# Patient Record
Sex: Male | Born: 1965 | Race: Black or African American | Hispanic: No | Marital: Married | Smoking: Never smoker
Health system: Southern US, Community
[De-identification: ages and names within clinical notes are randomized; demographics above are authoritative.]

## PROBLEM LIST (undated history)

## (undated) DIAGNOSIS — E119 Type 2 diabetes mellitus without complications: Secondary | ICD-10-CM

## (undated) DIAGNOSIS — I10 Essential (primary) hypertension: Secondary | ICD-10-CM

## (undated) DIAGNOSIS — M109 Gout, unspecified: Secondary | ICD-10-CM

---

## 2009-12-11 ENCOUNTER — Emergency Department: Payer: Self-pay | Admitting: Emergency Medicine

## 2010-02-24 ENCOUNTER — Emergency Department: Payer: Self-pay | Admitting: Emergency Medicine

## 2014-11-06 ENCOUNTER — Observation Stay: Payer: Self-pay | Admitting: Internal Medicine

## 2014-11-06 LAB — CBC WITH DIFFERENTIAL/PLATELET
BASOS PCT: 1.2 %
Basophil #: 0.1 10*3/uL (ref 0.0–0.1)
Eosinophil #: 0.1 10*3/uL (ref 0.0–0.7)
Eosinophil %: 1.6 %
HCT: 41.8 % (ref 40.0–52.0)
HGB: 13.2 g/dL (ref 13.0–18.0)
Lymphocyte #: 1.9 10*3/uL (ref 1.0–3.6)
Lymphocyte %: 26.3 %
MCH: 26.8 pg (ref 26.0–34.0)
MCHC: 31.6 g/dL — AB (ref 32.0–36.0)
MCV: 85 fL (ref 80–100)
Monocyte #: 0.6 x10 3/mm (ref 0.2–1.0)
Monocyte %: 8.5 %
NEUTROS ABS: 4.4 10*3/uL (ref 1.4–6.5)
Neutrophil %: 62.4 %
Platelet: 161 10*3/uL (ref 150–440)
RBC: 4.93 10*6/uL (ref 4.40–5.90)
RDW: 14.3 % (ref 11.5–14.5)
WBC: 7.1 10*3/uL (ref 3.8–10.6)

## 2014-11-06 LAB — COMPREHENSIVE METABOLIC PANEL
Albumin: 4.6 g/dL (ref 3.4–5.0)
Alkaline Phosphatase: 68 U/L
Anion Gap: 9 (ref 7–16)
BILIRUBIN TOTAL: 0.4 mg/dL (ref 0.2–1.0)
BUN: 56 mg/dL — ABNORMAL HIGH (ref 7–18)
CALCIUM: 10 mg/dL (ref 8.5–10.1)
CO2: 27 mmol/L (ref 21–32)
Chloride: 95 mmol/L — ABNORMAL LOW (ref 98–107)
Creatinine: 2.23 mg/dL — ABNORMAL HIGH (ref 0.60–1.30)
EGFR (African American): 41 — ABNORMAL LOW
GFR CALC NON AF AMER: 34 — AB
GLUCOSE: 438 mg/dL — AB (ref 65–99)
OSMOLALITY: 297 (ref 275–301)
POTASSIUM: 5.6 mmol/L — AB (ref 3.5–5.1)
SGOT(AST): 43 U/L — ABNORMAL HIGH (ref 15–37)
SGPT (ALT): 70 U/L — ABNORMAL HIGH
Sodium: 131 mmol/L — ABNORMAL LOW (ref 136–145)
Total Protein: 8.7 g/dL — ABNORMAL HIGH (ref 6.4–8.2)

## 2014-11-06 LAB — URINALYSIS, COMPLETE
BILIRUBIN, UR: NEGATIVE
BLOOD: NEGATIVE
Glucose,UR: >=500 mg/dL (ref 0–75)
Hyaline Cast: 5
KETONE: NEGATIVE
LEUKOCYTE ESTERASE: NEGATIVE
NITRITE: NEGATIVE
Ph: 5 (ref 4.5–8.0)
Protein: NEGATIVE
RBC,UR: 1 /HPF (ref 0–5)
SPECIFIC GRAVITY: 1.015 (ref 1.003–1.030)
Squamous Epithelial: <1
WBC UR: 1 /HPF (ref 0–5)

## 2014-11-06 LAB — BASIC METABOLIC PANEL
Anion Gap: 10 (ref 7–16)
BUN: 53 mg/dL — ABNORMAL HIGH (ref 7–18)
CALCIUM: 8.8 mg/dL (ref 8.5–10.1)
CO2: 24 mmol/L (ref 21–32)
Chloride: 99 mmol/L (ref 98–107)
Creatinine: 2.02 mg/dL — ABNORMAL HIGH (ref 0.60–1.30)
EGFR (African American): 46 — ABNORMAL LOW
GFR CALC NON AF AMER: 38 — AB
Glucose: 385 mg/dL — ABNORMAL HIGH (ref 65–99)
OSMOLALITY: 297 (ref 275–301)
POTASSIUM: 5.4 mmol/L — AB (ref 3.5–5.1)
Sodium: 133 mmol/L — ABNORMAL LOW (ref 136–145)

## 2014-11-06 LAB — PRO B NATRIURETIC PEPTIDE: B-TYPE NATIURETIC PEPTID: 11 pg/mL (ref 0–125)

## 2014-11-06 LAB — TROPONIN I: Troponin-I: <0.02 ng/mL

## 2014-11-06 LAB — LIPASE, BLOOD: Lipase: 119 U/L (ref 73–393)

## 2014-11-07 LAB — BASIC METABOLIC PANEL
ANION GAP: 7 (ref 7–16)
BUN: 40 mg/dL — ABNORMAL HIGH (ref 7–18)
CALCIUM: 8.7 mg/dL (ref 8.5–10.1)
Chloride: 100 mmol/L (ref 98–107)
Co2: 26 mmol/L (ref 21–32)
Creatinine: 1.83 mg/dL — ABNORMAL HIGH (ref 0.60–1.30)
EGFR (Non-African Amer.): 42 — ABNORMAL LOW
GFR CALC AF AMER: 51 — AB
GLUCOSE: 240 mg/dL — AB (ref 65–99)
OSMOLALITY: 284 (ref 275–301)
Potassium: 4.1 mmol/L (ref 3.5–5.1)
Sodium: 133 mmol/L — ABNORMAL LOW (ref 136–145)

## 2014-11-07 LAB — HEMOGLOBIN A1C: Hemoglobin A1C: 12.1 % — ABNORMAL HIGH (ref 4.2–6.3)

## 2014-11-07 LAB — LIPID PANEL
Cholesterol: 260 mg/dL — ABNORMAL HIGH (ref 0–200)
HDL Cholesterol: 28 mg/dL — ABNORMAL LOW (ref 40–60)
TRIGLYCERIDES: 659 mg/dL — AB (ref 0–200)

## 2014-11-07 LAB — CBC WITH DIFFERENTIAL/PLATELET
Basophil #: 0 10*3/uL (ref 0.0–0.1)
Basophil %: 0.7 %
EOS PCT: 2.3 %
Eosinophil #: 0.1 10*3/uL (ref 0.0–0.7)
HCT: 37.3 % — ABNORMAL LOW (ref 40.0–52.0)
HGB: 11.9 g/dL — ABNORMAL LOW (ref 13.0–18.0)
LYMPHS ABS: 1.6 10*3/uL (ref 1.0–3.6)
LYMPHS PCT: 29.6 %
MCH: 27.3 pg (ref 26.0–34.0)
MCHC: 32 g/dL (ref 32.0–36.0)
MCV: 85 fL (ref 80–100)
Monocyte #: 0.5 x10 3/mm (ref 0.2–1.0)
Monocyte %: 8.5 %
NEUTROS ABS: 3.2 10*3/uL (ref 1.4–6.5)
Neutrophil %: 58.9 %
Platelet: 141 10*3/uL — ABNORMAL LOW (ref 150–440)
RBC: 4.37 10*6/uL — ABNORMAL LOW (ref 4.40–5.90)
RDW: 14.5 % (ref 11.5–14.5)
WBC: 5.5 10*3/uL (ref 3.8–10.6)

## 2014-11-07 LAB — CLOSTRIDIUM DIFFICILE(ARMC)

## 2014-11-08 LAB — COMPREHENSIVE METABOLIC PANEL WITH GFR
Albumin: 3.8 g/dL
Alkaline Phosphatase: 54 U/L
Anion Gap: 6 — ABNORMAL LOW
BUN: 27 mg/dL — ABNORMAL HIGH
Bilirubin,Total: 0.4 mg/dL
Calcium, Total: 8.8 mg/dL
Chloride: 101 mmol/L
Co2: 27 mmol/L
Creatinine: 1.62 mg/dL — ABNORMAL HIGH
EGFR (African American): 59 — ABNORMAL LOW
EGFR (Non-African Amer.): 49 — ABNORMAL LOW
Glucose: 193 mg/dL — ABNORMAL HIGH
Osmolality: 279
Potassium: 4.3 mmol/L
SGOT(AST): 38 U/L — ABNORMAL HIGH
SGPT (ALT): 64 U/L — ABNORMAL HIGH
Sodium: 134 mmol/L — ABNORMAL LOW
Total Protein: 7 g/dL

## 2014-11-08 LAB — CBC WITH DIFFERENTIAL/PLATELET
Basophil #: 0 x10 3/mm 3
Basophil %: 1 %
Eosinophil #: 0.1 x10 3/mm 3
Eosinophil %: 2.4 %
HCT: 36.4 % — ABNORMAL LOW
HGB: 11.6 g/dL — ABNORMAL LOW
Lymphocyte %: 33.4 %
Lymphs Abs: 1.7 x10 3/mm 3
MCH: 26.7 pg
MCHC: 31.9 g/dL — ABNORMAL LOW
MCV: 84 fL
Monocyte #: 0.5 "x10 3/mm "
Monocyte %: 9.1 %
Neutrophil #: 2.7 x10 3/mm 3
Neutrophil %: 54.1 %
Platelet: 142 x10 3/mm 3 — ABNORMAL LOW
RBC: 4.35 x10 6/mm 3 — ABNORMAL LOW
RDW: 13.7 %
WBC: 4.9 x10 3/mm 3

## 2014-11-10 LAB — STOOL CULTURE

## 2014-11-14 ENCOUNTER — Ambulatory Visit: Payer: Self-pay | Admitting: Internal Medicine

## 2014-11-27 ENCOUNTER — Ambulatory Visit: Payer: Self-pay | Admitting: Internal Medicine

## 2014-12-26 ENCOUNTER — Ambulatory Visit
Admit: 2014-12-26 | Disposition: A | Discharge status: Home / Self Care | Payer: Self-pay | Attending: Internal Medicine | Admitting: Internal Medicine

## 2015-02-25 NOTE — Discharge Summary (Signed)
PATIENT NAME:  [name], George MR#:  [id number] DATE OF BIRTH:  16-Aug-1966  DATE OF ADMISSION:  11/06/2014 DATE OF DISCHARGE:  11/08/2014  ADMITTING DIAGNOSES:  Nausea, weakness.     DISCHARGE DIAGNOSES:  1.  Nausea and weakness due to acute renal failure and diarrhea.  2.  Acute renal failure due to prerenal in nature, improved with IV hydration, and holding of lisinopril hydrochlorothiazide and aldactone.   3.  New onset diabetes. The patient started on insulin with a hemoglobin A1c of 12.  4.  Nausea, diarrhea, likely due to gastroenteritis, now resolved.  5.  Hyperlipidemia.  6.  Hypertension.  7.  Morbid obesity.   CONSULTANTS: Diabetic teaching.   PERTINENT LABORATORY DATA AND EVALUATIONS: Admitting glucose 438, BUN 56, creatinine 2.23, sodium 131, potassium 5.6, chloride 95, CO2 27, calcium 10, lipase 119. LFTs showed a total protein of 8.7, albumin of 4.6, AST 43, ALT 70. troponin less than 0.02. WBC 7.1, hemoglobin 13.2, platelet count was 161,000. Stool cultures, no growth.  Clostridium difficile was negative. Urinalysis showed nitrites negative, leukocytes negative.   HOSPITAL COURSE: Please refer to H and P done by the admitting physician. The patient is a 49 year old African American male who presented with nausea, vomiting, and feeling weak. The patient was seen in the ED and was noted to have acute renal failure; therefore, he was admitted for IV fluids.  He was also noticed to have very high blood sugars. He was noted to have a new onset diabetes. The patient was admitted, given IV fluids with improvement in his renal function. He also, for his diabetes, was started on insulin and received diabetic teaching.  The patient's blood sugars are improved. His renal functions improved. I still recommended that he continue to hold lisinopril hydrochlorothiazide, spironolactone and potassium supplements until okayed by his primary care provider.  At this time, he is doing much better and is  stable for discharge.   DISCHARGE MEDICATIONS: Atenolol 25 p.o. daily, amlodipine 10 daily, Clobetasol topically applied to affected area b.i.d. as previously, glipizide 5 mg daily, fenofibrate 145 mg daily, Lantus 18 units at bedtime.   DIET: Low sodium, low fat, low cholesterol, carbohydrate-controlled diet.   ACTIVITY: As tolerated.   FOLLOW-UP:  Primary MD in 1 to 2 weeks, keep a blood sugar log to take to primary MD.    TIME SPENT: This patient: 35 minutes.    ____________________________ Lacie ScottsShreyang H. Allena KatzPatel, MD shp:DT D: 11/09/2014 13:13:10 ET T: 11/09/2014 19:45:48 ET JOB#: [id number]  cc: Maron Stanzione H. Allena KatzPatel, MD, <Dictator> Charise CarwinSHREYANG H Chett Taniguchi MD ELECTRONICALLY SIGNED 11/13/2014 12:43

## 2015-02-25 NOTE — H&P (Signed)
PATIENT NAME:  George Adkins, George Adkins MR#:  [id number] DATE OF BIRTH:  [birth year]  DATE OF ADMISSION:  11/06/2014  PRIMARY CARE PHYSICIAN: [facility]  REFERRING EMERGENCY ROOM PHYSICIAN: Cecille Amsterdam. Mayford Knife, MD  CHIEF COMPLAINT: Nausea, and feeling weak.   HISTORY OF PRESENT ILLNESS: A 49 year old male who has a history of hypertension and was told to be having prediabetes 2 years ago, for the last 3 weeks has complained of having some nausea and having some loose stool intermittently. Initially thought maybe it is just viral syndrome and will go away, but he continued to have these symptoms more and more, and feeling overall weak, so finally decided to come to the Emergency Room today. In the ER, he was noticed to have elevated blood sugar levels and renal failure. He was given some IV fluids and given insulin, but sugar level did not came down satisfactorily, as his renal function also did not improve much, so given to hospitalist team for further management. On further questioning, he denies any fever or chills.   REVIEW OF SYSTEMS:  CONSTITUTIONAL: Negative for fever. Positive for fatigue and generalized weakness. No weight loss or weight gain.  EYES: No blurring, double vision, discharge or redness.  EARS, NOSE, THROAT: No tinnitus, ear pain or hearing loss.  RESPIRATORY: No cough, wheezing, hemoptysis, or shortness of breath.  CARDIOVASCULAR: No chest pain, orthopnea, edema, arrhythmia, palpitation.  GASTROINTESTINAL: The patient has some nausea and some loose stool. No constipation or abdominal pain.  GENITOURINARY: No dysuria, hematuria, increased frequency.  ENDOCRINE: No heat or cold intolerance. No excessive sweating.  SKIN: No acne, rashes, or lesions.  MUSCULOSKELETAL: No pain or swelling in the joints.  NEUROLOGICAL: No numbness, weakness, tremor, or vertigo.  PSYCHIATRIC: No anxiety, insomnia, bipolar disorder.   PAST MEDICAL HISTORY:  1.  Hypertension.  2.  Prediabetes.   3.  He was told he was having some problem with the kidneys, but does not remember, 2 years ago, and after that he does not know anything his PMD told about that on followup visits.   SURGICAL HISTORY: Chronic venous congestion, in the lower extremities and has had venous surgeries done on lower extremities.   SOCIAL HISTORY: Lives with family, his wife, and he works in Insurance account manager currently. He denies smoking. He is a social alcohol drinker, not every day, and denies any illegal drug use.   FAMILY HISTORY: Positive for hypertension, diabetes, and congestive heart failure in multiple family members, including his mother.   HOME MEDICATIONS:  1.  Spironolactone 25 mg oral 2 tablets once a day.  2.  Lisinopril 20 mg once a day.  3.  Potassium chloride 20 mEq once a day.  4.  Hydrochlorothiazide 25 mg take 1/2 tablet once a day.  5.  Clobetasol 0.05% topical cream to affected area (mostly on the scalp) 2 times a day.  6.  Atenolol 25 mg once a day.  7.  Amlodipine 10 mg once a day.   ASSESSMENT AND PLAN: A 49 year old male who has a history of prediabetes and some renal issues, and has hypertension, came to the Emergency Room with some vague complaints and found having elevated blood sugar level and renal failure.  1.  Acute renal failure. Most likely there is a component of chronic renal failure also; we do not have baseline laboratories to compare with. We will give IV fluids for now and follow renal function tomorrow. Meanwhile, we will hold any nephrotoxic medications like Lisinopril and hydrochlorothiazide.  2.  Diabetes. Blood sugar is 400. The patient is obese. We counseled him about diet control, and as sugar is too high, he will need to be started on treatment right now. Because of renal failure, he is not a good candidate for oral antidiabetic medications now. I started him on long-acting insulin and sliding scale coverage. If his kidney function improved significantly, then we can switch  it to oral medications, like metformin, on discharge.  3.  Hypertension. Blood pressure is under control currently, but he was taking lisinopril and hydrochlorothiazide. I will hold that and continue monitoring his blood pressure.  4.  Obesity. Will check lipid panel tomorrow morning. We will treat accordingly.  5.  Hypokalemia. Potassium level is high with renal failure, but came down with IV fluid up to 5.4. There are no EKG changes. Will continue monitoring and follow tomorrow.   CODE STATUS: Full Code.   TOTAL TIME SPENT ON THIS ADMISSION: 50 minutes.    ____________________________ Hope PigeonVaibhavkumar G. Elisabeth PigeonVachhani, MD vgv:MT D: 11/06/2014 14:13:19 ET T: 11/06/2014 15:02:00 ET JOB#: [id number]  cc: Hope PigeonVaibhavkumar G. Elisabeth PigeonVachhani, MD, <Dictator> Altamese DillingVAIBHAVKUMAR Reba Hulett MD ELECTRONICALLY SIGNED 11/20/2014 23:08

## 2015-07-30 ENCOUNTER — Encounter: Payer: Self-pay | Admitting: Dietician

## 2018-02-17 ENCOUNTER — Other Ambulatory Visit: Payer: Self-pay | Admitting: Internal Medicine

## 2018-02-17 DIAGNOSIS — E269 Hyperaldosteronism, unspecified: Secondary | ICD-10-CM

## 2018-02-22 ENCOUNTER — Ambulatory Visit: Admission: RE | Admit: 2018-02-22 | Payer: Managed Care, Other (non HMO) | Source: Ambulatory Visit

## 2018-07-12 ENCOUNTER — Ambulatory Visit
Admission: RE | Admit: 2018-07-12 | Discharge: 2018-07-12 | Disposition: A | Discharge status: Home / Self Care | Payer: Managed Care, Other (non HMO) | Source: Ambulatory Visit | Attending: Internal Medicine | Admitting: Internal Medicine

## 2018-07-12 DIAGNOSIS — E279 Disorder of adrenal gland, unspecified: Secondary | ICD-10-CM | POA: Insufficient documentation

## 2018-07-12 DIAGNOSIS — R160 Hepatomegaly, not elsewhere classified: Secondary | ICD-10-CM | POA: Diagnosis not present

## 2018-07-12 DIAGNOSIS — K760 Fatty (change of) liver, not elsewhere classified: Secondary | ICD-10-CM | POA: Diagnosis not present

## 2018-07-12 DIAGNOSIS — E269 Hyperaldosteronism, unspecified: Secondary | ICD-10-CM

## 2018-07-14 ENCOUNTER — Other Ambulatory Visit: Payer: Self-pay | Admitting: Internal Medicine

## 2018-07-14 DIAGNOSIS — E278 Other specified disorders of adrenal gland: Secondary | ICD-10-CM

## 2018-07-28 ENCOUNTER — Ambulatory Visit: Admission: RE | Admit: 2018-07-28 | Payer: Managed Care, Other (non HMO) | Source: Ambulatory Visit

## 2018-08-10 ENCOUNTER — Ambulatory Visit
Admission: RE | Admit: 2018-08-10 | Discharge: 2018-08-10 | Disposition: A | Discharge status: Home / Self Care | Payer: Managed Care, Other (non HMO) | Source: Ambulatory Visit | Attending: Internal Medicine | Admitting: Internal Medicine

## 2018-08-10 DIAGNOSIS — E278 Other specified disorders of adrenal gland: Secondary | ICD-10-CM

## 2018-08-10 HISTORY — DX: Essential (primary) hypertension: I10

## 2018-08-10 HISTORY — DX: Type 2 diabetes mellitus without complications: E11.9

## 2018-08-10 MED ORDER — GADOBUTROL 1 MMOL/ML IV SOLN: 10.0000 mL | Freq: Once | INTRAVENOUS | Status: DC | PRN

## 2018-09-06 ENCOUNTER — Other Ambulatory Visit: Payer: Self-pay | Admitting: Internal Medicine

## 2018-09-06 DIAGNOSIS — E278 Other specified disorders of adrenal gland: Secondary | ICD-10-CM

## 2018-09-17 ENCOUNTER — Ambulatory Visit
Admission: RE | Admit: 2018-09-17 | Discharge: 2018-09-17 | Disposition: A | Discharge status: Home / Self Care | Payer: Managed Care, Other (non HMO) | Source: Ambulatory Visit | Attending: Internal Medicine | Admitting: Internal Medicine

## 2018-09-17 DIAGNOSIS — E278 Other specified disorders of adrenal gland: Secondary | ICD-10-CM

## 2018-09-17 MED ORDER — GADOBENATE DIMEGLUMINE 529 MG/ML IV SOLN
20.0000 mL | Freq: Once | INTRAVENOUS | Status: AC | PRN
  Administered 2018-09-17: 20 mL via INTRAVENOUS

## 2019-02-07 ENCOUNTER — Emergency Department
Admission: EM | Admit: 2019-02-07 | Discharge: 2019-02-07 | Disposition: A | Discharge status: Home / Self Care | Payer: Managed Care, Other (non HMO) | Attending: Emergency Medicine | Admitting: Emergency Medicine

## 2019-02-07 ENCOUNTER — Other Ambulatory Visit: Payer: Self-pay

## 2019-02-07 ENCOUNTER — Encounter: Payer: Self-pay | Admitting: Emergency Medicine

## 2019-02-07 ENCOUNTER — Emergency Department: Payer: Managed Care, Other (non HMO)

## 2019-02-07 DIAGNOSIS — M109 Gout, unspecified: Secondary | ICD-10-CM | POA: Insufficient documentation

## 2019-02-07 DIAGNOSIS — M25571 Pain in right ankle and joints of right foot: Secondary | ICD-10-CM | POA: Diagnosis present

## 2019-02-07 DIAGNOSIS — I10 Essential (primary) hypertension: Secondary | ICD-10-CM | POA: Diagnosis not present

## 2019-02-07 DIAGNOSIS — E119 Type 2 diabetes mellitus without complications: Secondary | ICD-10-CM | POA: Diagnosis not present

## 2019-02-07 HISTORY — DX: Gout, unspecified: M10.9

## 2019-02-07 MED ORDER — COLCHICINE 0.6 MG PO TABS
1.2000 mg | ORAL_TABLET | Freq: Once | ORAL | Status: AC
  Administered 2019-02-07: 1.2 mg via ORAL
  Filled 2019-02-07: qty 2

## 2019-02-07 MED ORDER — OXYCODONE HCL 5 MG PO TABS: 5.0000 mg | ORAL_TABLET | Freq: Three times a day (TID) | ORAL | 0 refills | Status: AC | PRN

## 2019-02-07 MED ORDER — OXYCODONE HCL 5 MG PO TABS
5.0000 mg | ORAL_TABLET | Freq: Once | ORAL | Status: AC
  Administered 2019-02-07: 5 mg via ORAL
  Filled 2019-02-07: qty 1

## 2019-02-07 MED ORDER — COLCHICINE 0.6 MG PO TABS: ORAL_TABLET | ORAL | 0 refills | Status: AC

## 2019-02-07 MED ORDER — ACETAMINOPHEN 500 MG PO TABS
1000.0000 mg | ORAL_TABLET | Freq: Once | ORAL | Status: AC
  Administered 2019-02-07: 1000 mg via ORAL
  Filled 2019-02-07: qty 2

## 2019-02-07 NOTE — Discharge Instructions (Addendum)
Pain control: Take tylenol 1000mg every 8 hours. Take 5mg of oxycodone every 6 hours for breakthrough pain. If you need the oxycodone make sure to take one senokot as well to prevent constipation.  Do not drink alcohol, drive or participate in any other potentially dangerous activities while taking this medication as it may make you sleepy. Do not take this medication with any other sedating medications, either prescription or over-the-counter.  

## 2019-02-07 NOTE — ED Notes (Signed)
Patient AAOX4. Vitals stable. NAD. 

## 2019-02-07 NOTE — ED Provider Notes (Signed)
[facility] Emergency Department Provider Note  ____________________________________________  Time seen: Approximately 9:57 AM  I have reviewed the triage vital signs and the nursing notes.   HISTORY  Chief Complaint Knee Pain and Ankle Pain   HPI George Adkins is a 53 y.o. male with a history of diabetes, hypertension, gout who presents for evaluation of right ankle pain.  Patient reports the pain started 5 days ago.  Pain is sharp, constant, worse with weightbearing.  Patient has been taking Tylenol with no significant relief.  Patient reports the symptoms are similar to prior gout flare.  He has had gout on bilateral ankles in the past.  He does not have colchicine at home.  Patient is on chronic steroids for adrenal insufficiency.  Patient also has a history of DVT and he is on Lovenox injections.  He is also complaining of mild left knee pain which she has had for several weeks after twisting his leg and sustaining a muscle strain.  He has hematoma in the right thigh which has been improving.  Past Medical History:  Diagnosis Date  . Diabetes mellitus without complication (HCC)   . Gout   . Hypertension     There are no active problems to display for this patient.   History reviewed. No pertinent surgical history.  Prior to Admission medications   Medication Sig Start Date End Date Taking? Authorizing Provider  colchicine 0.6 MG tablet Take 0.6 mg three times a day on day 1 and then twice a day until 48 hours of no pain 02/07/19   Don Perking, [place], MD  oxyCODONE (ROXICODONE) 5 MG immediate release tablet Take 1 tablet (5 mg total) by mouth every 8 (eight) hours as needed. 02/07/19 02/07/20  Nita Sickle, MD    Allergies Patient has no known allergies.  No family history on file.  Social History Social History   Tobacco Use  . Smoking status: Never Smoker  . Smokeless tobacco: Never Used  Substance Use Topics  . Alcohol use: Never    Frequency: Never  . Drug use: Not on file    Review of Systems  Constitutional: Negative for fever. Eyes: Negative for visual changes. ENT: Negative for sore throat. Neck: No neck pain  Cardiovascular: Negative for chest pain. Respiratory: Negative for shortness of breath. Gastrointestinal: Negative for abdominal pain, vomiting or diarrhea. Genitourinary: Negative for dysuria. Musculoskeletal: Negative for back pain. + R ankle pain and L knee pain Skin: Negative for rash. Neurological: Negative for headaches, weakness or numbness. Psych: No SI or HI  ____________________________________________   PHYSICAL EXAM:  VITAL SIGNS: ED Triage Vitals  Enc Vitals Group     BP 02/07/19 0836 126/72     Pulse Rate 02/07/19 0836 96     Resp 02/07/19 0836 18     Temp 02/07/19 0836 98 F (36.7 C)     Temp Source 02/07/19 0836 Oral     SpO2 02/07/19 0836 96 %     Weight 02/07/19 0834 (!) 350 lb (158.8 kg)     Height 02/07/19 0834  (1.88 m)     Head Circumference --      Peak Flow --      Pain Score 02/07/19 0834 8     Pain Loc --      Pain Edu? --      Excl. in GC? --     Constitutional: Alert and oriented. Well appearing and in no apparent distress. HEENT:  Head: Normocephalic and atraumatic.         Eyes: Conjunctivae are normal. Sclera is non-icteric.       Mouth/Throat: Mucous membranes are moist.       Neck: Supple with no signs of meningismus. Cardiovascular: Regular rate and rhythm. No murmurs, gallops, or rubs. 2+ symmetrical distal pulses are present in all extremities. No JVD. Respiratory: Normal respiratory effort. Lungs are clear to auscultation bilaterally. No wheezes, crackles, or rhonchi.  Musculoskeletal: There is swelling of the right ankle with no warmth or erythema, patient has some tenderness with range of motion but range of motion is mostly intact.  Right knee has normal range of motion with no pain on palpation, no swelling, erythema, or warmth.    Neurologic: Normal speech and language. Face is symmetric. Moving all extremities. No gross focal neurologic deficits are appreciated. Skin: Skin is warm, dry and intact. No rash noted. Psychiatric: Mood and affect are normal. Speech and behavior are normal.  ____________________________________________   LABS (all labs ordered are listed, but only abnormal results are displayed)  Labs Reviewed - No data to display ____________________________________________  EKG  none  ____________________________________________  RADIOLOGY  I have personally reviewed the images performed during this visit and I agree with the Radiologist's read.   Interpretation by Radiologist:  Dg Ankle Complete Right  Result Date: 02/07/2019 CLINICAL DATA:  Pain to right ankle and left knee for 5 days. No trauma. History of diabetes in gout. EXAM: RIGHT ANKLE - COMPLETE 3+ VIEW COMPARISON:  None. FINDINGS: There is lateral medial swelling in the ankle. No soft tissue gas noted. No acute fractures are seen. No bony erosion. IMPRESSION: Soft tissue swelling.  No acute bony abnormality identified. Electronically Signed   By: Gerome Samavid  Williams III M.D   On: 02/07/2019 09:18     ____________________________________________   PROCEDURES  Procedure(s) performed: None Procedures Critical Care performed:  None ____________________________________________   INITIAL IMPRESSION / ASSESSMENT AND PLAN / ED COURSE  53 y.o. male with a history of diabetes, hypertension, gout who presents for evaluation of right ankle pain.  Most likely gout to since patient has had in the past.  Patient is on chronic steroids.  He was given colchicine.  Will send home on colchicine.  Provided a prescription for oxycodone and discussed taking Tylenol and oxycodone at home for pain.  No clinical suspicion at this time for septic joint with mostly intact range of motion.  X-ray of the ankle shows no fractures or dislocations.  No x-ray of  the knee was done since patient had one done a week ago which showed degenerative changes only.  Discussed and return precautions and close follow-up with primary care doctor.      As part of my medical decision making, I reviewed the following data within the electronic MEDICAL RECORD NUMBER Nursing notes reviewed and incorporated, Old chart reviewed, Radiograph reviewed , Notes from prior ED visits and [place] Controlled Substance Database    Pertinent labs & imaging results that were available during my care of the patient were reviewed by me and considered in my medical decision making (see chart for details).    ____________________________________________   FINAL CLINICAL IMPRESSION(S) / ED DIAGNOSES  Final diagnoses:  Acute gout of right ankle, unspecified cause      NEW MEDICATIONS STARTED DURING THIS VISIT:  ED Discharge Orders         Ordered    oxyCODONE (ROXICODONE) 5 MG immediate release tablet  Every 8  hours PRN     02/07/19 0957    colchicine 0.6 MG tablet     02/07/19 0957           Note:  This document was prepared using Dragon voice recognition software and may include unintentional dictation errors.    Don Perking, [place], MD 02/07/19 1001

## 2019-02-07 NOTE — ED Triage Notes (Signed)
Presents with pain to right ankle and left knee   States he has hx of gout and unsure if this is a flare  Also states he had a muscle pull to left thigh prior to pain   Also states gait is unsteady

## 2020-12-26 IMAGING — DX RIGHT ANKLE - COMPLETE 3+ VIEW
3 series · 3 of 3 positions shown · non-contrast
Comparison: None.

CLINICAL DATA: Pain to right ankle and left knee for 5 days. No
trauma. History of diabetes in gout.

EXAM:
RIGHT ANKLE - COMPLETE 3+ VIEW

[ankle ap]
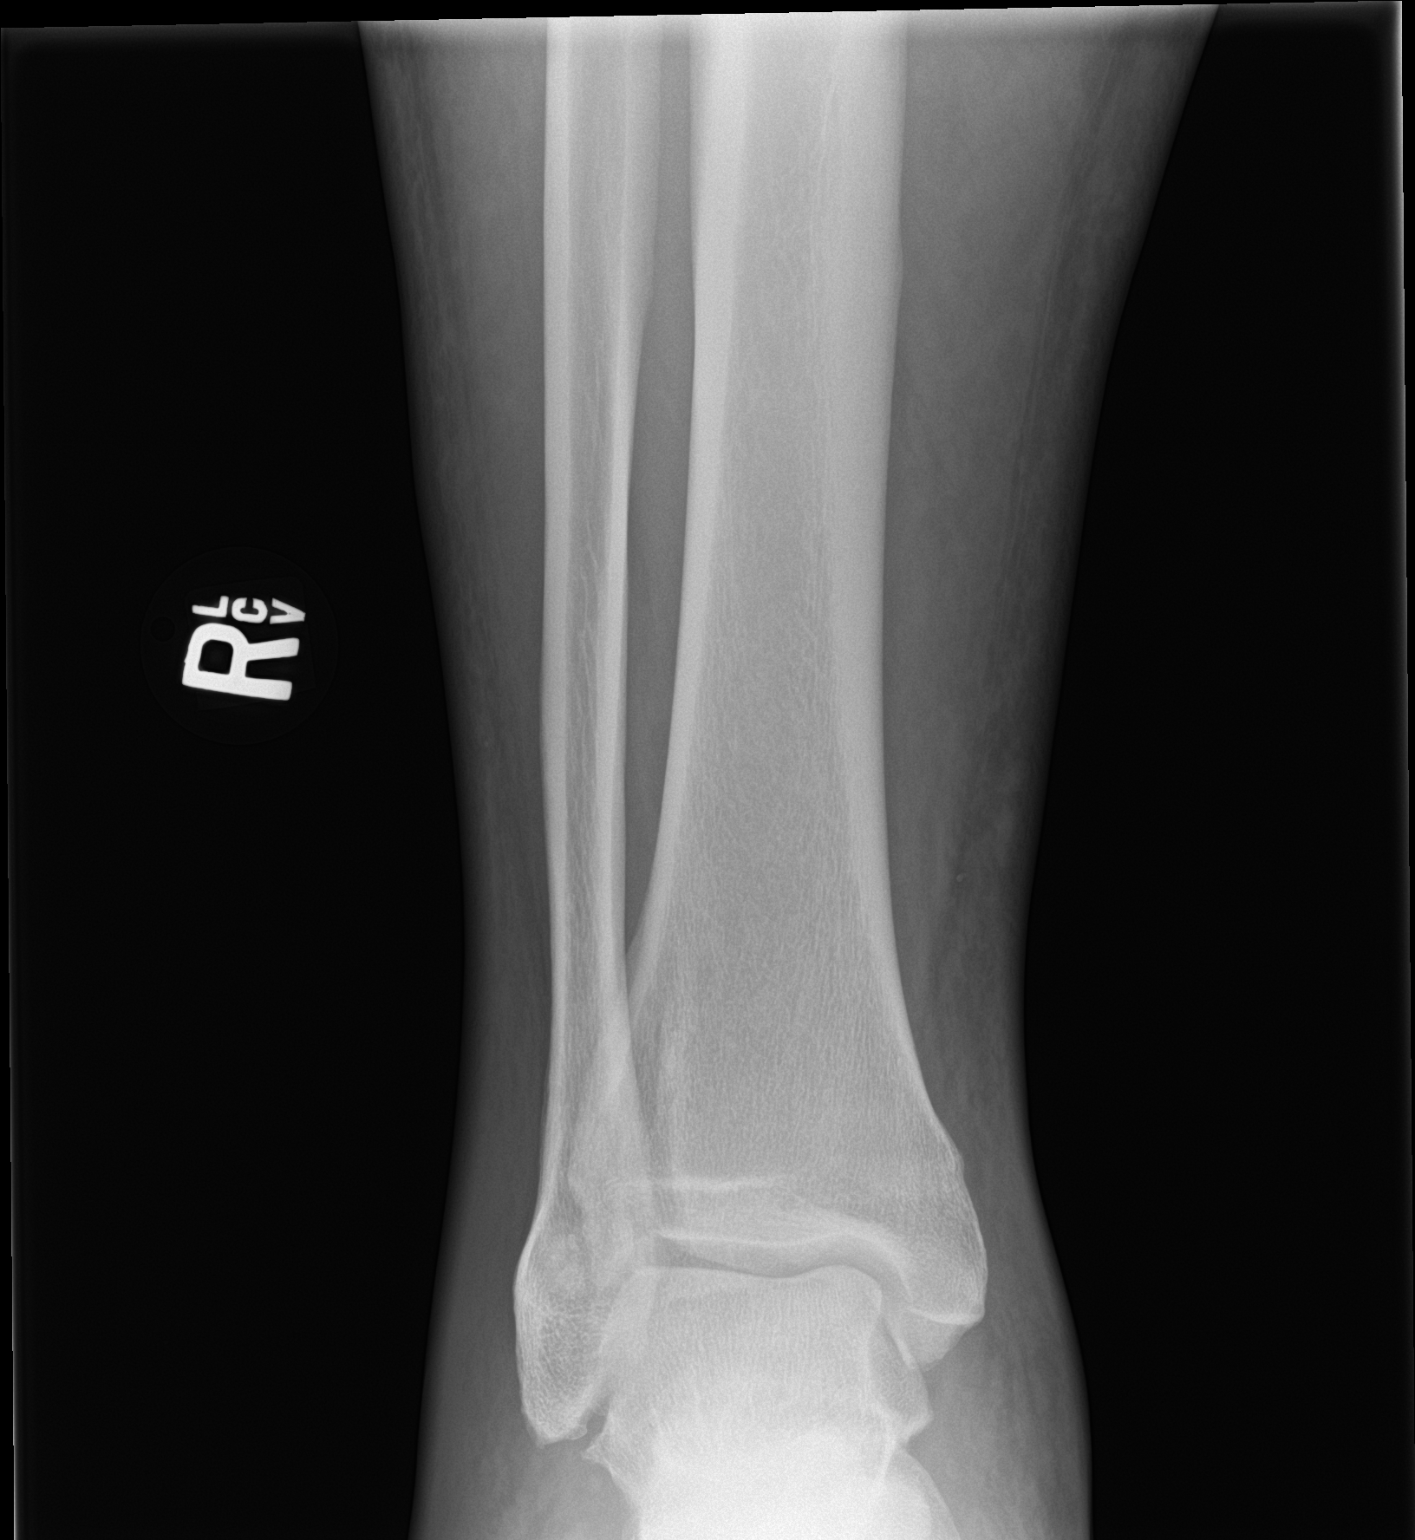

[ankle obl]
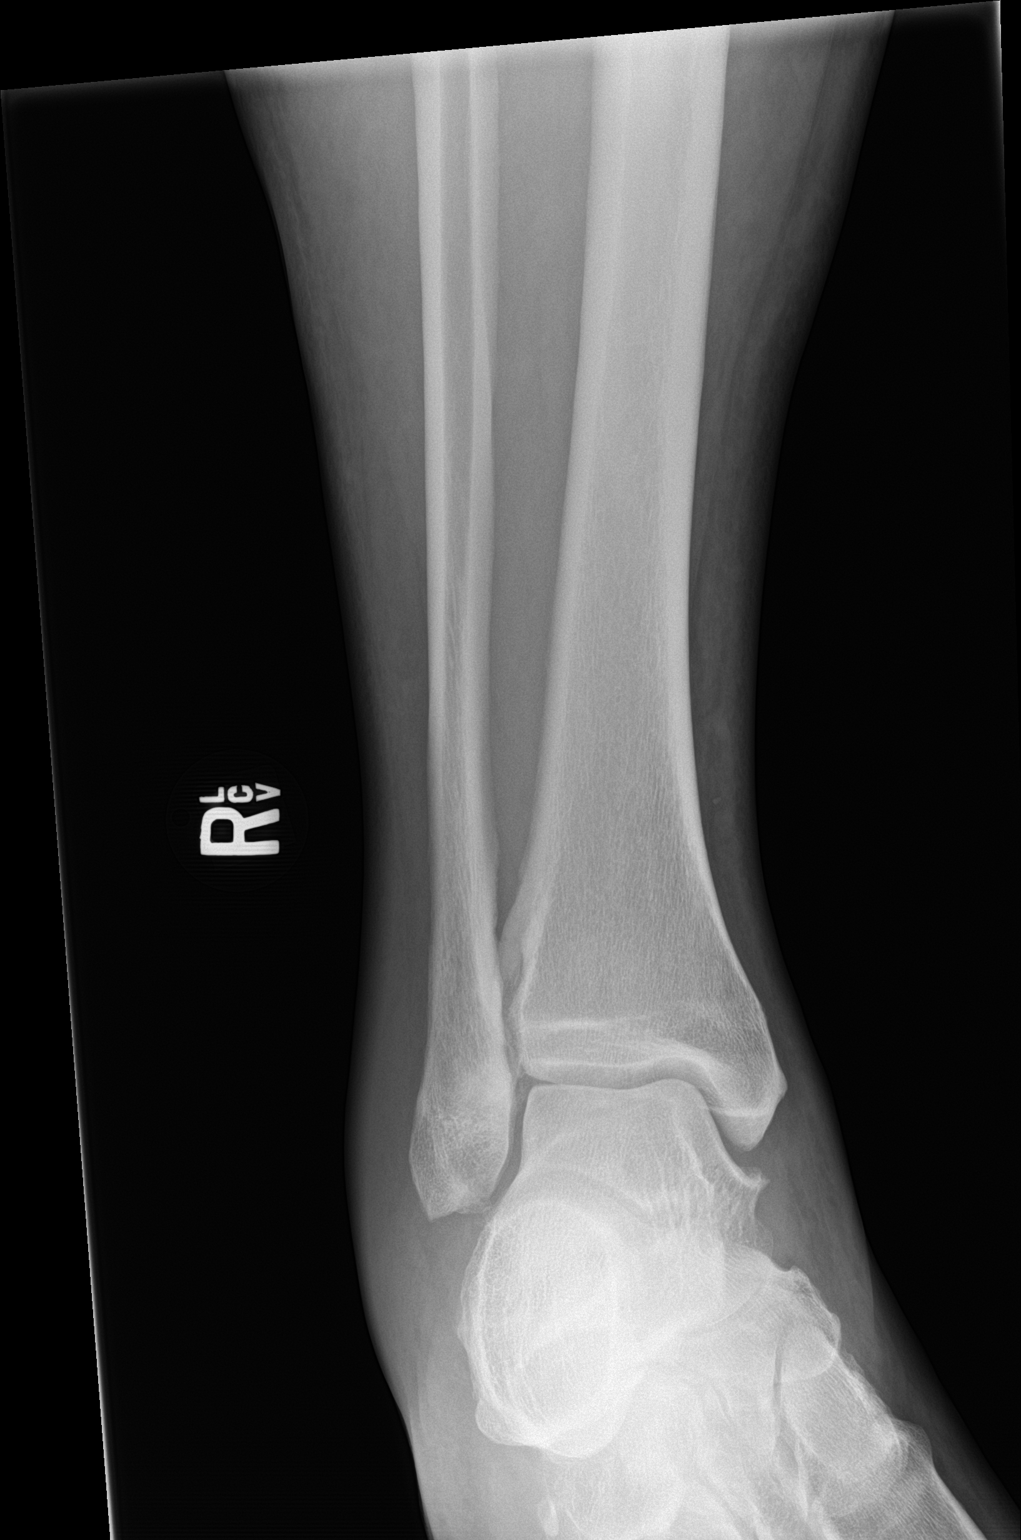

[ankle lat]
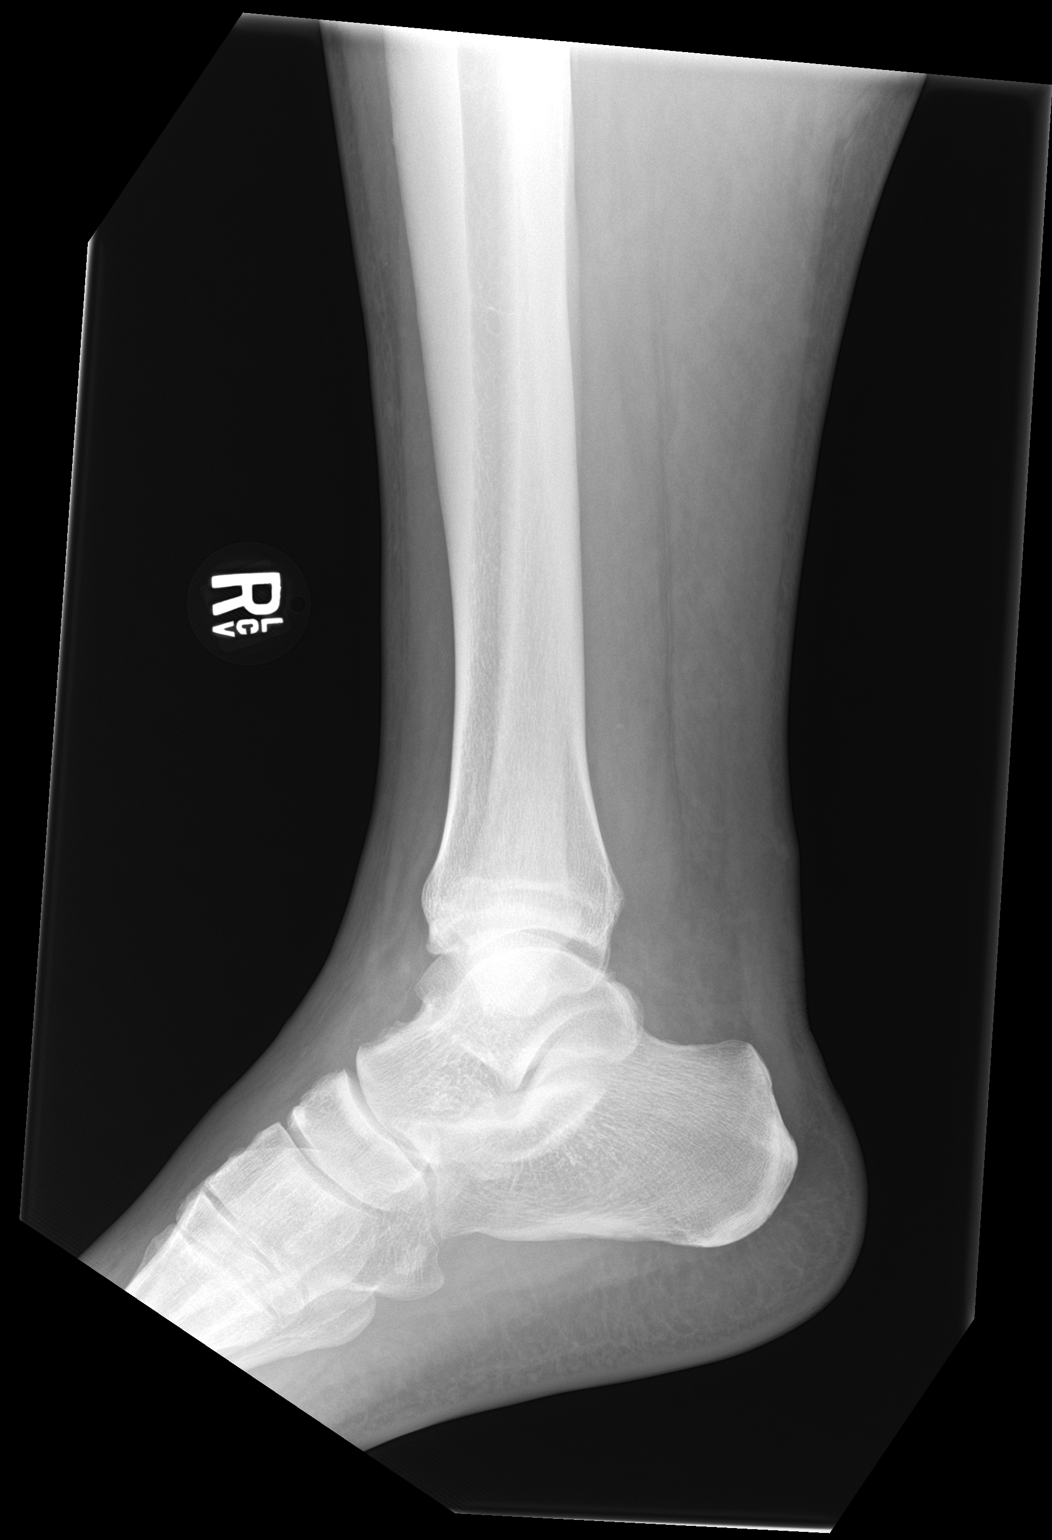

[3 of 3 positions shown; findings below may reference images not displayed]

FINDINGS: There is lateral medial swelling in the ankle. No soft tissue gas
noted. No acute fractures are seen. No bony erosion.
IMPRESSION: Soft tissue swelling.  No acute bony abnormality identified.

## 2024-05-24 ENCOUNTER — Other Ambulatory Visit: Payer: Self-pay | Admitting: Medical Genetics

## 2024-05-30 ENCOUNTER — Other Ambulatory Visit
Admission: RE | Admit: 2024-05-30 | Discharge: 2024-05-30 | Disposition: A | Discharge status: Home / Self Care | Payer: Self-pay | Source: Ambulatory Visit | Attending: Medical Genetics | Admitting: Medical Genetics

## 2024-06-12 LAB — GENECONNECT MOLECULAR SCREEN: Genetic Analysis Overall Interpretation: NEGATIVE
